# Patient Record
Sex: Male | Born: 1995 | Race: Black or African American | Hispanic: No | Marital: Single | Smoking: Never smoker
Health system: Southern US, Community
[De-identification: ages and names within clinical notes are randomized; demographics above are authoritative.]

## PROBLEM LIST (undated history)

## (undated) DIAGNOSIS — E669 Obesity, unspecified: Secondary | ICD-10-CM

## (undated) DIAGNOSIS — S62609A Fracture of unspecified phalanx of unspecified finger, initial encounter for closed fracture: Secondary | ICD-10-CM

## (undated) HISTORY — DX: Obesity, unspecified: E66.9

## (undated) HISTORY — DX: Fracture of unspecified phalanx of unspecified finger, initial encounter for closed fracture: S62.609A

---
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
## 2015-01-18 ENCOUNTER — Other Ambulatory Visit: Payer: BC Managed Care – PPO

## 2015-02-02 ENCOUNTER — Other Ambulatory Visit (INDEPENDENT_AMBULATORY_CARE_PROVIDER_SITE_OTHER): Payer: BC Managed Care – PPO

## 2015-02-02 DIAGNOSIS — Z23 Encounter for immunization: Secondary | ICD-10-CM

## 2016-12-01 HISTORY — PX: NO PAST SURGERIES: SHX2092

## 2016-12-24 ENCOUNTER — Encounter: Payer: Self-pay | Admitting: Medical

## 2016-12-24 ENCOUNTER — Ambulatory Visit (INDEPENDENT_AMBULATORY_CARE_PROVIDER_SITE_OTHER): Payer: BC Managed Care – PPO | Admitting: Medical

## 2016-12-24 VITALS — BP 150/102 | HR 80 | Ht 71.5 in | Wt >= 6400 oz

## 2016-12-24 DIAGNOSIS — Z8249 Family history of ischemic heart disease and other diseases of the circulatory system: Secondary | ICD-10-CM | POA: Insufficient documentation

## 2016-12-24 DIAGNOSIS — R030 Elevated blood-pressure reading, without diagnosis of hypertension: Secondary | ICD-10-CM

## 2016-12-24 DIAGNOSIS — Z0000 Encounter for general adult medical examination without abnormal findings: Secondary | ICD-10-CM | POA: Insufficient documentation

## 2016-12-24 DIAGNOSIS — R7301 Impaired fasting glucose: Secondary | ICD-10-CM | POA: Diagnosis not present

## 2016-12-24 DIAGNOSIS — Z7189 Other specified counseling: Secondary | ICD-10-CM | POA: Insufficient documentation

## 2016-12-24 DIAGNOSIS — Z7185 Encounter for immunization safety counseling: Secondary | ICD-10-CM

## 2016-12-24 LAB — LIPID PANEL
Cholesterol: 182 mg/dL (ref <200)
HDL: 34 mg/dL — AB (ref >40)
LDL CALC: 135 mg/dL — AB (ref <100)
TRIGLYCERIDES: 65 mg/dL (ref <150)
Total CHOL/HDL Ratio: 5.4 Ratio — ABNORMAL HIGH (ref <5.0)
VLDL: 13 mg/dL (ref <30)

## 2016-12-24 LAB — COMPREHENSIVE METABOLIC PANEL
ALBUMIN: 4 g/dL (ref 3.6–5.1)
ALT: 16 U/L (ref 9–46)
AST: 19 U/L (ref 10–40)
Alkaline Phosphatase: 98 U/L (ref 40–115)
BILIRUBIN TOTAL: 0.7 mg/dL (ref 0.2–1.2)
BUN: 10 mg/dL (ref 7–25)
CHLORIDE: 104 mmol/L (ref 98–110)
CO2: 22 mmol/L (ref 20–31)
CREATININE: 0.83 mg/dL (ref 0.60–1.35)
Calcium: 9.4 mg/dL (ref 8.6–10.3)
GLUCOSE: 106 mg/dL — AB (ref 65–99)
Potassium: 4.2 mmol/L (ref 3.5–5.3)
SODIUM: 138 mmol/L (ref 135–146)
Total Protein: 7 g/dL (ref 6.1–8.1)

## 2016-12-24 LAB — CBC
HEMATOCRIT: 42.3 % (ref 38.5–50.0)
Hemoglobin: 13.8 g/dL (ref 13.2–17.1)
MCH: 23.9 pg — AB (ref 27.0–33.0)
MCHC: 32.6 g/dL (ref 32.0–36.0)
MCV: 73.2 fL — AB (ref 80.0–100.0)
MPV: 9.3 fL (ref 7.5–12.5)
Platelets: 225 10*3/uL (ref 140–400)
RBC: 5.78 MIL/uL (ref 4.20–5.80)
RDW: 15.7 % — AB (ref 11.0–15.0)
WBC: 10.6 10*3/uL — ABNORMAL HIGH (ref 4.0–10.5)

## 2016-12-24 LAB — TSH: TSH: 1.68 mIU/L (ref 0.40–4.50)

## 2016-12-24 NOTE — Patient Instructions (Addendum)
Encounter Diagnoses  Name Primary?  . Encounter for health maintenance examination in adult Yes  . Elevated blood-pressure reading without diagnosis of hypertension   . Impaired fasting blood sugar   . Morbid obesity (HCC)   . Vaccine counseling   . Family history of hypertension     Recommendations: It was a pleasure to see you today  Your blood pressure was quite elevated today  Please check your blood pressure at school such as student health or at the pharmacy, and write down readings for the next 2 weeks  Return the blood pressure readings to me within 2 weeks.   You may need to start blood pressure medication depending upon these readings  I will need to see you back in regards to blood pressure  Borderline diabetes  In 2015 your blood sugars were elevated  This puts you at risk for diabetes  We are checking these labs today  Eat a healthy low fat diet, exercise preferably 5 or more days per week with walking or  Hiking, some weights  Work on losing 15lb in the next 2 months as a first step  Obesity   Work on losing weight as having excess weight leads to:  hypertension  Diabetes  Back pain  Puts you at risk for sleep apnea  See your dentist yearly for routine dental care including hygiene visits twice yearly.  Check your testicles monthly for a self testicular cancer screen  Vaccines: I recommend you complete the 3 shot human papilloma virus vaccine series.  This is given at time 0, 2 months and 6 months  I recommend you get a yearly flu shot  I recommend you get an updated Tdap, tetanus diptheria and pertussis vaccine. This should be done no later than 07/2017  I recommend the Bexsero meningitis vaccine. This is 2 vaccines given 1 month apart.  Call the office when you want to return for these vaccines   Hypertension Hypertension, commonly called high blood pressure, is when the force of blood pumping through your arteries is too strong. Your  arteries are the blood vessels that carry blood from your heart throughout your body. A blood pressure reading consists of a higher number over a lower number, such as 110/72. The higher number (systolic) is the pressure inside your arteries when your heart pumps. The lower number (diastolic) is the pressure inside your arteries when your heart relaxes. Ideally you want your blood pressure below 120/80. Hypertension forces your heart to work harder to pump blood. Your arteries may become narrow or stiff. Having untreated or uncontrolled hypertension can cause heart attack, stroke, kidney disease, and other problems. What increases the risk? Some risk factors for high blood pressure are controllable. Others are not. Risk factors you cannot control include:  Race. You may be at higher risk if you are African American.  Age. Risk increases with age.  Gender. Men are at higher risk than women before age 21 years. After age 765, women are at higher risk than men. Risk factors you can control include:  Not getting enough exercise or physical activity.  Being overweight.  Getting too much fat, sugar, calories, or salt in your diet.  Drinking too much alcohol. What are the signs or symptoms? Hypertension does not usually cause signs or symptoms. Extremely high blood pressure (hypertensive crisis) may cause headache, anxiety, shortness of breath, and nosebleed. How is this diagnosed? To check if you have hypertension, your health care provider will measure your blood pressure  while you are seated, with your arm held at the level of your heart. It should be measured at least twice using the same arm. Certain conditions can cause a difference in blood pressure between your right and left arms. A blood pressure reading that is higher than normal on one occasion does not mean that you need treatment. If it is not clear whether you have high blood pressure, you may be asked to return on a different day to  have your blood pressure checked again. Or, you may be asked to monitor your blood pressure at home for 1 or more weeks. How is this treated? Treating high blood pressure includes making lifestyle changes and possibly taking medicine. Living a healthy lifestyle can help lower high blood pressure. You may need to change some of your habits. Lifestyle changes may include:  Following the DASH diet. This diet is high in fruits, vegetables, and whole grains. It is low in salt, red meat, and added sugars.  Keep your sodium intake below 2,300 mg per day.  Getting at least 30-45 minutes of aerobic exercise at least 4 times per week.  Losing weight if necessary.  Not smoking.  Limiting alcoholic beverages.  Learning ways to reduce stress. Your health care provider may prescribe medicine if lifestyle changes are not enough to get your blood pressure under control, and if one of the following is true:  You are 34-20 years of age and your systolic blood pressure is above 140.  You are 73 years of age or older, and your systolic blood pressure is above 150.  Your diastolic blood pressure is above 90.  You have diabetes, and your systolic blood pressure is over 140 or your diastolic blood pressure is over 90.  You have kidney disease and your blood pressure is above 140/90.  You have heart disease and your blood pressure is above 140/90. Your personal target blood pressure may vary depending on your medical conditions, your age, and other factors. Follow these instructions at home:  Have your blood pressure rechecked as directed by your health care provider.  Take medicines only as directed by your health care provider. Follow the directions carefully. Blood pressure medicines must be taken as prescribed. The medicine does not work as well when you skip doses. Skipping doses also puts you at risk for problems.  Do not smoke.  Monitor your blood pressure at home as directed by your health  care provider. Contact a health care provider if:  You think you are having a reaction to medicines taken.  You have recurrent headaches or feel dizzy.  You have swelling in your ankles.  You have trouble with your vision. Get help right away if:  You develop a severe headache or confusion.  You have unusual weakness, numbness, or feel faint.  You have severe chest or abdominal pain.  You vomit repeatedly.  You have trouble breathing. This information is not intended to replace advice given to you by your health care provider. Make sure you discuss any questions you have with your health care provider. Document Released: 11/17/2005 Document Revised: 04/24/2016 Document Reviewed: 09/09/2013 Elsevier Interactive Patient Education  2017 Elsevier Inc[employer]    Obesity, Adult Introduction Obesity is having too much body fat. If you have a BMI of 30 or more, you are obese. BMI is a number that explains how much body fat you have. Obesity is often caused by taking in (consuming) more calories than your body uses. Obesity can cause serious  health problems. Changing your lifestyle can help to treat obesity. Follow these instructions at home: Eating and drinking  Follow advice from your doctor about what to eat and drink. Your doctor may tell you to:  Cut down on (limit) fast foods, sweets, and processed snack foods.  Choose low-fat options. For example, choose low-fat milk instead of whole milk.  Eat 5 or more servings of fruits or vegetables every day.  Eat at home more often. This gives you more control over what you eat.  Choose healthy foods when you eat out.  Learn what a healthy portion size is. A portion size is the amount of a certain food that is healthy for you to eat at one time. This is different for each person.  Keep low-fat snacks available.  Avoid sugary drinks. These include soda, fruit juice, iced tea that is sweetened with sugar, and flavored milk.  Eat a  healthy breakfast.  Drink enough water to keep your pee (urine) clear or pale yellow.  Do not go without eating for long periods of time (do not fast).  Do not go on popular or trendy diets (fad diets). Physical Activity  Exercise often, as told by your doctor. Ask your doctor:  What types of exercise are safe for you.  How often you should exercise.  Warm up and stretch before being active.  Do slow stretching after being active (cool down).  Rest between times of being active. Lifestyle  Limit how much time you spend in front of your TV, computer, or video game system (be less sedentary).  Find ways to reward yourself that do not involve food.  Limit alcohol intake to no more than 1 drink a day for nonpregnant women and 2 drinks a day for men. One drink equals 12 oz of beer, 5 oz of wine, or 1 oz of hard liquor. General instructions  Keep a weight loss journal. This can help you keep track of:  The food that you eat.  The exercise that you do.  Take over-the-counter and prescription medicines only as told by your doctor.  Take vitamins and supplements only as told by your doctor.  Think about joining a support group. Your doctor may be able to help with this.  Keep all follow-up visits as told by your doctor. This is important. Contact a doctor if:  You cannot meet your weight loss goal after you have changed your diet and lifestyle for 6 weeks. This information is not intended to replace advice given to you by your health care provider. Make sure you discuss any questions you have with your health care provider. Document Released: 02/09/2012 Document Revised: 04/24/2016 Document Reviewed: 09/05/2015  2017 Elsevier    Health Maintenance, Male A healthy lifestyle and preventative care can promote health and wellness.  Maintain regular health, dental, and eye exams.  Eat a healthy diet. Foods like vegetables, fruits, whole grains, low-fat dairy products, and  lean protein foods contain the nutrients you need and are low in calories. Decrease your intake of foods high in solid fats, added sugars, and salt. Get information about a proper diet from your health care provider, if necessary.  Regular physical exercise is one of the most important things you can do for your health. Most adults should get at least 150 minutes of moderate-intensity exercise (any activity that increases your heart rate and causes you to sweat) each week. In addition, most adults need muscle-strengthening exercises on 2 or more days a week.  Maintain a healthy weight. The body mass index (BMI) is a screening tool to identify possible weight problems. It provides an estimate of body fat based on height and weight. Your health care provider can find your BMI and can help you achieve or maintain a healthy weight. For males 20 years and older:  A BMI below 18.5 is considered underweight.  A BMI of 18.5 to 24.9 is normal.  A BMI of 25 to 29.9 is considered overweight.  A BMI of 30 and above is considered obese.  Maintain normal blood lipids and cholesterol by exercising and minimizing your intake of saturated fat. Eat a balanced diet with plenty of fruits and vegetables. Blood tests for lipids and cholesterol should begin at age 66 and be repeated every 5 years. If your lipid or cholesterol levels are high, you are over age 69, or you are at high risk for heart disease, you may need your cholesterol levels checked more frequently.Ongoing high lipid and cholesterol levels should be treated with medicines if diet and exercise are not working.  If you smoke, find out from your health care provider how to quit. If you do not use tobacco, do not start.  Lung cancer screening is recommended for adults aged 55-80 years who are at high risk for developing lung cancer because of a history of smoking. A yearly low-dose CT scan of the lungs is recommended for people who have at least a  30-pack-year history of smoking and are current smokers or have quit within the past 15 years. A pack year of smoking is smoking an average of 1 pack of cigarettes a day for 1 year (for example, a 30-pack-year history of smoking could mean smoking 1 pack a day for 30 years or 2 packs a day for 15 years). Yearly screening should continue until the smoker has stopped smoking for at least 15 years. Yearly screening should be stopped for people who develop a health problem that would prevent them from having lung cancer treatment.  If you choose to drink alcohol, do not have more than 2 drinks per day. One drink is considered to be 12 oz (360 mL) of beer, 5 oz (150 mL) of wine, or 1.5 oz (45 mL) of liquor.  Avoid the use of street drugs. Do not share needles with anyone. Ask for help if you need support or instructions about stopping the use of drugs.  High blood pressure causes heart disease and increases the risk of stroke. High blood pressure is more likely to develop in:  People who have blood pressure in the end of the normal range (100-139/85-89 mm Hg).  People who are overweight or obese.  People who are African American.  If you are 47-44 years of age, have your blood pressure checked every 3-5 years. If you are 6 years of age or older, have your blood pressure checked every year. You should have your blood pressure measured twice-once when you are at a hospital or clinic, and once when you are not at a hospital or clinic. Record the average of the two measurements. To check your blood pressure when you are not at a hospital or clinic, you can use:  An automated blood pressure machine at a pharmacy.  A home blood pressure monitor.  If you are 63-107 years old, ask your health care provider if you should take aspirin to prevent heart disease.  Diabetes screening involves taking a blood sample to check your fasting blood sugar level. This  should be done once every 3 years after age 37 if you  are at a normal weight and without risk factors for diabetes. Testing should be considered at a younger age or be carried out more frequently if you are overweight and have at least 1 risk factor for diabetes.  Colorectal cancer can be detected and often prevented. Most routine colorectal cancer screening begins at the age of 76 and continues through age 52. However, your health care provider may recommend screening at an earlier age if you have risk factors for colon cancer. On a yearly basis, your health care provider may provide home test kits to check for hidden blood in the stool. A small camera at the end of a tube may be used to directly examine the colon (sigmoidoscopy or colonoscopy) to detect the earliest forms of colorectal cancer. Talk to your health care provider about this at age 39 when routine screening begins. A direct exam of the colon should be repeated every 5-10 years through age 24, unless early forms of precancerous polyps or small growths are found.  People who are at an increased risk for hepatitis B should be screened for this virus. You are considered at high risk for hepatitis B if:  You were born in a country where hepatitis B occurs often. Talk with your health care provider about which countries are considered high risk.  Your parents were born in a high-risk country and you have not received a shot to protect against hepatitis B (hepatitis B vaccine).  You have HIV or AIDS.  You use needles to inject street drugs.  You live with, or have sex with, someone who has hepatitis B.  You are a man who has sex with other men (MSM).  You get hemodialysis treatment.  You take certain medicines for conditions like cancer, organ transplantation, and autoimmune conditions.  Hepatitis C blood testing is recommended for all people born from [birth year] through 1965 and any individual with known risk factors for hepatitis C.  Healthy men should no longer receive prostate-specific  antigen (PSA) blood tests as part of routine cancer screening. Talk to your health care provider about prostate cancer screening.  Testicular cancer screening is not recommended for adolescents or adult males who have no symptoms. Screening includes self-exam, a health care provider exam, and other screening tests. Consult with your health care provider about any symptoms you have or any concerns you have about testicular cancer.  Practice safe sex. Use condoms and avoid high-risk sexual practices to reduce the spread of sexually transmitted infections (STIs).  You should be screened for STIs, including gonorrhea and chlamydia if:  You are sexually active and are younger than 24 years.  You are older than 24 years, and your health care provider tells you that you are at risk for this type of infection.  Your sexual activity has changed since you were last screened, and you are at an increased risk for chlamydia or gonorrhea. Ask your health care provider if you are at risk.  If you are at risk of being infected with HIV, it is recommended that you take a prescription medicine daily to prevent HIV infection. This is called pre-exposure prophylaxis (PrEP). You are considered at risk if:  You are a man who has sex with other men (MSM).  You are a heterosexual man who is sexually active with multiple partners.  You take drugs by injection.  You are sexually active with a partner who has HIV.  Talk with your health care provider about whether you are at high risk of being infected with HIV. If you choose to begin PrEP, you should first be tested for HIV. You should then be tested every 3 months for as long as you are taking PrEP.  Use sunscreen. Apply sunscreen liberally and repeatedly throughout the day. You should seek shade when your shadow is shorter than you. Protect yourself by wearing long sleeves, pants, a wide-brimmed hat, and sunglasses year round whenever you are outdoors.  Tell  your health care provider of new moles or changes in moles, especially if there is a change in shape or color. Also, tell your health care provider if a mole is larger than the size of a pencil eraser.  A one-time screening for abdominal aortic aneurysm (AAA) and surgical repair of large AAAs by ultrasound is recommended for men aged 65-75 years who are current or former smokers.  Stay current with your vaccines (immunizations). This information is not intended to replace advice given to you by your health care provider. Make sure you discuss any questions you have with your health care provider. Document Released: 05/15/2008 Document Revised: 12/08/2014 Document Reviewed: 08/21/2015 Elsevier Interactive Patient Education  2017 [employer].

## 2016-12-24 NOTE — Progress Notes (Signed)
Subjective:     Eric Walton is a 21 y.o. male who presents for a physical.   Last visit about 2.5 years ago.    He has no particular concerns.  Played football in high school.  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   Questioned about sleep apnea symptoms given elevated BP and obesity - does snore, no witnessed apnea, no daytime somnolence, not fatigue, no sleep concerns.  He is fasting today.  The following portions of the patient's history were reviewed and updated as appropriate: allergies, current medications, past family history, past medical history, past social history, past surgical history.  Review of Systems A comprehensive review of systems was negative  Past Medical History:  Diagnosis Date  . Finger fracture, right    ring finger foot ball injury  . Obesity     Past Surgical History:  Procedure Laterality Date  . NO PAST SURGERIES  12/2016    Social History   Social History  . Marital status: Single    Spouse name: N/A  . Number of children: N/A  . Years of education: N/A   Occupational History  . Not on file.   Social History Main Topics  . Smoking status: Never Smoker  . Smokeless tobacco: Never Used  . Alcohol use No  . Drug use: No  . Sexual activity: Not on file   Other Topics Concern  . Not on file   Social History Narrative   Cardio with treadmil 10-15 minutes or bike.  Getting back into this more.   Going 2 nights per week.  Junior at [employer], Scientist, clinical (histocompatibility and immunogenetics).  Works with Water engineer in Engineer, maintenance (IT), effects of alcohol.   Played high school football.  12/2016    Family History  Problem Relation Age of Onset  . Hypertension Mother   . Hypertension Father   . Kidney disease Father   . Hypertension Maternal Grandfather   . Diabetes Paternal Grandmother   . Cancer Neg Hx   . Heart  disease Neg Hx   . Stroke Neg Hx     No current outpatient prescriptions on file.  No Known Allergies    Objective:    BP (!) 150/102   Pulse 80   Ht 5' 11.5" (1.816 m)   Wt (!) 432 lb (196 kg)   SpO2 98%   BMI 59.41 kg/m   BP Readings from Last 3 Encounters:  12/24/16 (!) 150/102  07/18/14 130/80   Wt Readings from Last 3 Encounters:  12/24/16 (!) 432 lb (196 kg)  07/18/14 (!) 390 lb (176.9 kg) (>99 %, Z > 2.33)*   * Growth percentiles are based on CDC 2-20 Years data.   General Appearance:  Alert, cooperative, no distress, appropriate for age, WD/ WN, obese AA male                            Head:  Normocephalic, without obvious abnormality                             Eyes:  PERRL, EOM's intact, conjunctiva and cornea clear, fundi benign, both eyes  Ears:  Significant cerumen bilat ear canals normal, both ears                            Nose:  Nares symmetrical, septum midline, mucosa pink, no lesions                                Throat:  Lips, tongue, and mucosa are moist, pink, and intact; teeth intact                             Neck:  Supple, no adenopathy, no thyromegaly, no tenderness/mass/nodules, no carotid bruit, no JVD                             Back:  Symmetrical, no curvature, ROM normal, no tenderness                           Lungs:  Clear to auscultation bilaterally, respirations unlabored                             Heart:  Normal PMI, regular rate & rhythm, S1 and S2 normal, no murmurs, rubs, or gallops                     Abdomen:  Soft, non-tender, bowel sounds active all four quadrants, no mass or organomegaly              Genitourinary: normal male genitalia,  circumcised, no masses, no hernia         Musculoskeletal:  Normal upper and lower extremity ROM, tone and strength strong and symmetrical, all extremities; no joint pain or edema                                       Lymphatic:  No adenopathy              Skin/Hair/Nails: There are several small scars about his forearms and lower legs that he should use to football injuries. There is a fairly large oval 3 cm x 2 cm oval scar on his right lower anterior leg                  Neurologic:  Alert and oriented x3, no cranial nerve deficits, normal strength and tone, gait steady   Assessment:   Encounter Diagnoses  Name Primary?  . Encounter for health maintenance examination in adult Yes  . Elevated blood-pressure reading without diagnosis of hypertension   . Impaired fasting blood sugar   . Morbid obesity (HCC)   . Vaccine counseling   . Family history of hypertension      Plan:   Discussed healthy lifestyle, prevention, diet, exercise, school performance, and safety.  Discussed vaccinations.    Recommendations: It was a pleasure to see you today  Your blood pressure was quite elevated today  Please check your blood pressure at school such as student health or at the pharmacy, and write down readings for the next 2 weeks  Return the blood pressure readings to me within 2 weeks.   You may need to start blood  pressure medication depending upon these readings  I will need to see you back in regards to blood pressure  Borderline diabetes  In 2015 your blood sugars were elevated  This puts you at risk for diabetes  We are checking these labs today  Eat a healthy low fat diet, exercise preferably 5 or more days per week with walking or  Hiking, some weights  Work on losing 15lb in the next 2 months as a first step  Obesity   Work on losing weight as having excess weight leads to:  hypertension  Diabetes  Back pain  Puts you at risk for sleep apnea  See your dentist yearly for routine dental care including hygiene visits twice yearly.  Vaccines: I recommend you complete the 3 shot human papilloma virus vaccine series.  This is given at time 0, 2 months and 6 months  I recommend you get a yearly flu shot  I recommend  you get an updated Tdap, tetanus diptheria and pertussis vaccine. This should be done no later than 07/2017  I recommend the Bexsero meningitis vaccine. This is 2 vaccines given 1 month apart.  Call the office when you want to return for these vaccines  Abimelec was seen today for physical.  Diagnoses and all orders for this visit:  Encounter for health maintenance examination in adult -     Comprehensive metabolic panel -     CBC -     TSH -     Lipid panel -     Hemoglobin A1c  Elevated blood-pressure reading without diagnosis of hypertension -     Comprehensive metabolic panel -     CBC -     TSH -     Lipid panel -     Hemoglobin A1c  Impaired fasting blood sugar -     Comprehensive metabolic panel -     CBC -     TSH -     Lipid panel -     Hemoglobin A1c  Morbid obesity (HCC) -     Comprehensive metabolic panel -     CBC -     TSH -     Lipid panel -     Hemoglobin A1c  Vaccine counseling  Family history of hypertension

## 2016-12-25 ENCOUNTER — Encounter: Payer: Self-pay | Admitting: Medical

## 2016-12-25 LAB — HEMOGLOBIN A1C
HEMOGLOBIN A1C: 6.1 % — AB (ref <5.7)
Mean Plasma Glucose: 128 mg/dL

## 2017-01-02 ENCOUNTER — Encounter: Payer: Self-pay | Admitting: Medical

## 2017-01-06 ENCOUNTER — Telehealth: Payer: Self-pay | Admitting: Medical

## 2017-01-06 NOTE — Telephone Encounter (Signed)
pls refer to nutritionist for borderline diabetes, elevated BP, obesity

## 2017-01-07 ENCOUNTER — Other Ambulatory Visit: Payer: Self-pay

## 2017-01-07 NOTE — Telephone Encounter (Signed)
Sent referral to nutritionist

## 2017-01-15 ENCOUNTER — Encounter: Payer: Self-pay | Admitting: Internal Medicine

## 2017-02-03 ENCOUNTER — Ambulatory Visit: Payer: Self-pay | Admitting: Registered"

## 2017-02-16 ENCOUNTER — Encounter: Payer: BC Managed Care – PPO | Attending: Medical | Admitting: Registered"

## 2017-02-16 DIAGNOSIS — Z713 Dietary counseling and surveillance: Secondary | ICD-10-CM | POA: Insufficient documentation

## 2017-02-16 NOTE — Patient Instructions (Addendum)
Plan: Physical activity 15-30 min each day Consider having breakfast daily and include protein Consider having a snack before and after workout Continue drinking water throughout the day Eat mindfully and notice when you feel satisfied Aim for 7 hours sleep at night

## 2017-02-16 NOTE — Progress Notes (Signed)
Medical Nutrition Therapy:  Appt start time: 1405 end time:  1455   Assessment:  Primary concerns today: Patient states he wants to avoid having health problems by eating healthier and losing weight. He and his family have recently changed some of their eating habits and they eat out less and include more vegetables in their meals. Patient states he has become more regular and has more energy.  Patient reports that he sleeps about 5-6 hours per night due to heavy class load and he was sleeping 7-8 hrs last semester. Patient reports he wakes up 6:30-7 am and usually doesn't have time to eat before going to school.  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 He lives at home with his family where they often eat dinner together.  Preferred Learning Style:   No preference indicated   Learning Readiness:   Change in progress  MEDICATIONS: none   DIETARY INTAKE:  Usual eating pattern includes 1-2 meals and 1-2 snacks per day.  24-hr recall:    B ( AM): none (time) OR pancakes   Snk ( AM): nothing OR may have something from vending machine L (10-11 AM): PB&J OR grilled chicken strips OR fish OR leftovers Snk ( PM): granola bar D (7-830 PM): vegetable based meals, chicken or fish Snk ( PM):  Beverages: 1/2 gal water, fruit punch, OJ   Usual physical activity: [employer] 4 am & ADLs  Estimated energy needs: 2000 calories 225 g carbohydrates 150 g protein 56 g fat  Progress Towards Goal(s):  In progress.   Nutritional Diagnosis:  [place]-3.3 Overweight/obesity As related to history of fast food meals.  As evidenced by diet history per patient and BMI of 62.    Intervention:  Nutrition Education. Discussed importance of balanced meals and not skipping meals. Also discussed guidelines for daily activity and sleep needs for brain development and health.   Plan: Physical activity 15-30 min each day Consider having breakfast daily and include  protein Consider having a snack before and after workout Continue drinking water throughout the day Eat mindfully and notice when you feel satisfied Aim for 7 hours sleep at night  Teaching Method Utilized:  Visual Auditory  Handouts given during visit include:  My Plate  Barriers to learning/adherence to lifestyle change: none  Demonstrated degree of understanding via:  Teach Back   Monitoring/Evaluation:  Dietary intake, exercise, and body weight in 1 month(s).

## 2017-03-19 ENCOUNTER — Encounter: Payer: BC Managed Care – PPO | Attending: Medical | Admitting: Registered"

## 2017-03-19 DIAGNOSIS — Z713 Dietary counseling and surveillance: Secondary | ICD-10-CM | POA: Insufficient documentation

## 2017-03-19 NOTE — Progress Notes (Signed)
Medical Nutrition Therapy:  Appt start time: 1405 end time:  1455  Assessment:  Primary concerns today: Patient returns for a follow-up for nutrition education on healthy eating.  Pt continues to have a regular exercise routine of 30 min cardio, 12 min core work. Pt states to increase physical activity on days when there is enough time between classes, instead of taking shuttle he will walk to class.  Patient states with finals coming up he will only get 4-5 hours some nights, but able to get 6-7 often. Pt states he has 3 more weeks of classes this semester. Patient reports he is getting breakfast in more often.  Patient is a Archivist and interested in Haematologist. He lives at home with his family where they often eat dinner together.  Preferred Learning Style:   No preference indicated   Learning Readiness:   Change in progress  MEDICATIONS: none   DIETARY INTAKE:  Usual eating pattern includes 1-2 meals and 1-2 snacks per day.  24-hr recall:  B ( AM): [place] yogurt before work out  Snk ( AM): water, carrot OR veggie chips OR lunch L (10-11 AM): [place], cheese, OR  Snk ( PM): none (busy) OR fruit smoothie  D (7-830 PM): vegetable based meals, zucchini & tofu Snk ( PM):  Beverages: 1/2 gal water, fruit punch, OJ   Usual physical activity: [employer] gym 4 am, [employer] walks extra during school day.  Estimated energy needs: 2000 calories 225 g carbohydrates 150 g protein 56 g fat  Progress Towards Goal(s):  In progress.   Nutritional Diagnosis:  [place]-3.3 Overweight/obesity As related to history of fast food meals.  As evidenced by diet history per patient and BMI of 62.    Intervention:  Nutrition Education. Discussed importance of balanced meals and not skipping meals. Also discussed guidelines for daily activity and sleep needs for brain development and health.   Plan: Physical activity 15-30 min each day (in progress) Consider having breakfast daily and  include protein (in progress) Consider having a snack before and after workout (considering) Continue drinking water throughout the day (in progress) Eat mindfully and notice when you feel satisfied (in progress) Aim for 7 hours sleep at night (50-70%)  Teaching Method Utilized:  Visual Auditory  Handouts given during visit include:  none  Barriers to learning/adherence to lifestyle change: none  Demonstrated degree of understanding via:  Teach Back   Monitoring/Evaluation:  Dietary intake, exercise, and body weight in 1 month(s).

## 2017-03-20 ENCOUNTER — Encounter: Payer: Self-pay | Admitting: Registered"

## 2022-08-06 ENCOUNTER — Encounter: Payer: Self-pay | Admitting: Internal Medicine

## 2022-09-09 ENCOUNTER — Encounter: Payer: Self-pay | Admitting: Internal Medicine
